# Patient Record
Sex: Male | Born: 1980 | Race: White | Hispanic: No | Marital: Married | Smoking: Never smoker
Health system: Southern US, Community
[De-identification: ages and names within clinical notes are randomized; demographics above are authoritative.]

## PROBLEM LIST (undated history)

## (undated) ENCOUNTER — Ambulatory Visit
Admission: EM | Disposition: A | Discharge status: Other Acute Inpatient Hospital | Payer: No Typology Code available for payment source

---
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
## 2020-11-29 ENCOUNTER — Other Ambulatory Visit: Payer: Self-pay

## 2020-11-29 ENCOUNTER — Ambulatory Visit: Payer: No Typology Code available for payment source | Admitting: Family Medicine

## 2020-11-29 ENCOUNTER — Encounter: Payer: Self-pay | Admitting: Family Medicine

## 2020-11-29 VITALS — BP 110/78 | HR 60 | Temp 97.5°F | Ht 72.0 in | Wt 202.1 lb

## 2020-11-29 DIAGNOSIS — G44209 Tension-type headache, unspecified, not intractable: Secondary | ICD-10-CM

## 2020-11-29 DIAGNOSIS — G4484 Primary exertional headache: Secondary | ICD-10-CM | POA: Diagnosis not present

## 2020-11-29 LAB — CBC WITH DIFFERENTIAL/PLATELET
Basophils Absolute: 0.1 10*3/uL (ref 0.0–0.1)
Basophils Relative: 1.3 % (ref 0.0–3.0)
Eosinophils Absolute: 0.2 10*3/uL (ref 0.0–0.7)
Eosinophils Relative: 3.3 % (ref 0.0–5.0)
HCT: 38.7 % — ABNORMAL LOW (ref 39.0–52.0)
Hemoglobin: 13.2 g/dL (ref 13.0–17.0)
Lymphocytes Relative: 31.5 % (ref 12.0–46.0)
Lymphs Abs: 1.9 10*3/uL (ref 0.7–4.0)
MCHC: 34.2 g/dL (ref 30.0–36.0)
MCV: 90.8 fl (ref 78.0–100.0)
Monocytes Absolute: 0.4 10*3/uL (ref 0.1–1.0)
Monocytes Relative: 6.6 % (ref 3.0–12.0)
Neutro Abs: 3.4 10*3/uL (ref 1.4–7.7)
Neutrophils Relative %: 57.3 % (ref 43.0–77.0)
Platelets: 262 10*3/uL (ref 150.0–400.0)
RBC: 4.26 Mil/uL (ref 4.22–5.81)
RDW: 13.6 % (ref 11.5–15.5)
WBC: 6 10*3/uL (ref 4.0–10.5)

## 2020-11-29 LAB — BASIC METABOLIC PANEL
BUN: 14 mg/dL (ref 6–23)
CO2: 32 mEq/L (ref 19–32)
Calcium: 9.6 mg/dL (ref 8.4–10.5)
Chloride: 103 mEq/L (ref 96–112)
Creatinine, Ser: 1.01 mg/dL (ref 0.40–1.50)
GFR: 94.29 mL/min (ref >60.00)
Glucose, Bld: 98 mg/dL (ref 70–99)
Potassium: 4.1 mEq/L (ref 3.5–5.1)
Sodium: 141 mEq/L (ref 135–145)

## 2020-11-29 LAB — TSH: TSH: 1.41 u[IU]/mL (ref 0.35–4.50)

## 2020-11-29 NOTE — Progress Notes (Signed)
Patient ID: Joe Gibson, male    DOB: Dec 31, 1980, 40 y.o.   MRN: [id number]  This visit was conducted in person.  BP 110/78 (BP Location: Left Arm, Patient Position: Sitting, Cuff Size: Large)   Pulse 60   Temp (!) 97.5 F (36.4 C) (Temporal)   Ht 6' (1.829 m)   Wt 202 lb 1.6 oz (91.7 kg)   SpO2 97%   BMI 27.41 kg/m    CC: new pt, headache Subjective:   HPI: Joe Gibson is a 39 y.o. male presenting on 11/29/2020 for New Patient (Initial Visit)   Started Mg and osteo biflex and vit D and fish oil about 6 months ago for general health.   COVID illness 10/11/2020, took aspirin 81mg  for a month. Symptoms fully resolved.   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 Doesn't think he needed medication for this.   3 wk h/o headaches that started after he started incorporating aerobic exercise (jump rope) into workout routine. No HA with weight lifting. Pulsatile R frontal headache consistently starts 45 seconds after starting to jump rope. Activity limiting, has to stop jumping rope. Headache persists for several hours, improves with excedrin. Recently noted after sex with wife.   No h/o migraines, photo/phonophobia, no nausea/vomiting. Not activity limiting to the point of needing to go to a dark quiet room.  No fevers/chills, neck stiffness, vision changes, unilateral numbness/weakness or paresthesias.   Does routinely get tension headaches but these headaches are different. TTH normally managed with excedrin with benefit.  No pheochromocytoma symptoms of sweating or tachycardia.  No HA with BM or with heavy lifting or straining.  Does routinely see chiropractor once a month.   No prior trauma or head injury.  No concussion history.  No fmhx aneurysms.   Drinking water regularly, limits diet sodas.  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 Interim medical history since our last visit reviewed. Allergies and medications reviewed and updated. Outpatient Medications Prior to Visit  Medication Sig Dispense Refill  . Cholecalciferol (D3) 50 MCG (2000 UT) TABS Take by mouth.    . Magnesium 500 MG CAPS Take by mouth.    . Misc Natural Products (OSTEO BI-FLEX ADV DOUBLE ST PO) Take by mouth.    . Omega-3 Fatty Acids (FISH OIL) 1200 MG CAPS Take by mouth.     No facility-administered medications prior to visit.     Per HPI unless specifically indicated in ROS section below Review of Systems Objective:  BP 110/78 (BP Location: Left Arm, Patient Position: Sitting, Cuff Size: Large)   Pulse 60   Temp (!) 97.5 F (36.4 C) (Temporal)   Ht 6' (1.829 m)   Wt 202 lb 1.6 oz (91.7 kg)   SpO2 97%   BMI 27.41 kg/m   Wt Readings from Last 3 Encounters:  11/29/20 202 lb 1.6 oz (91.7 kg)      Physical Exam Vitals and nursing note reviewed.  Constitutional:      Appearance: Normal appearance. He is not ill-appearing.  HENT:     Head: Normocephalic and atraumatic.     Right Ear: Tympanic membrane,  ear canal and external ear normal. There is no impacted cerumen.     Left Ear: Tympanic membrane, ear canal and external ear normal. There is no impacted cerumen.     Mouth/Throat:     Mouth: Mucous membranes are moist.     Pharynx: Oropharynx is clear. No oropharyngeal exudate or posterior oropharyngeal erythema.  Eyes:     General: Lids are normal.        Right eye: No discharge.        Left eye: No discharge.     Extraocular Movements: Extraocular movements intact.     Conjunctiva/sclera: Conjunctivae normal.     Right eye: Right conjunctiva is not injected.     Left eye: Left conjunctiva is  not injected.     Pupils: Pupils are equal, round, and reactive to light.     Funduscopic exam:    Right eye: No papilledema.        Left eye: No papilledema.     Comments: No papilledema appreciated on limited fundoscopic exam.  Neck:     Thyroid: No thyromegaly or thyroid tenderness.     Vascular: No carotid bruit.  Cardiovascular:     Rate and Rhythm: Normal rate and regular rhythm.     Pulses: Normal pulses.     Heart sounds: Normal heart sounds. No murmur heard.   Pulmonary:     Effort: Pulmonary effort is normal. No respiratory distress.     Breath sounds: Normal breath sounds. No wheezing, rhonchi or rales.  Musculoskeletal:        General: Normal range of motion.     Cervical back: Normal range of motion and neck supple. No rigidity.     Right lower leg: No edema.     Left lower leg: No edema.  Lymphadenopathy:     Cervical: No cervical adenopathy.  Skin:    General: Skin is warm and dry.     Capillary Refill: Capillary refill takes less than 2 seconds.     Findings: No rash.  Neurological:     General: No focal deficit present.     Mental Status: He is alert and oriented to person, place, and time. Mental status is at baseline.     Cranial Nerves: Cranial nerves are intact. No cranial nerve deficit.     Sensory: Sensation is intact.     Motor: Motor function is intact. No weakness or tremor.     Coordination: Coordination is intact. Romberg sign negative. Coordination normal. Finger-Nose-Finger Test normal. Rapid alternating movements normal.     Gait: Gait is intact.     Comments:  CN 2-12 intact FTN intact EOMI No pronator drift  Psychiatric:        Mood and Affect: Mood normal.        Behavior: Behavior normal.       No results found for this or any previous visit. Assessment & Plan:  This visit occurred during the SARS-CoV-2 public health emergency.  Safety protocols were in place, including screening questions prior to the visit, additional usage of  staff PPE, and extensive cleaning of exam room while observing appropriate contact time as indicated for disinfecting solutions.   Over 45 minutes were spent face-to-face with the patient during this encounter or in coordination of care, reviewing past records, time spent documenting, lab results or radiology, or educating the patient or family.  Problem List Items Addressed This Visit    Exertional headache - Primary    New exercise  headaches also present after sex. Will need head imaging to r/o secondary cause (ie vascular abnormality, mass occupying lesion, etc). Discussed possibility of primary exercise headache and if that is the case may try PRN NSAID prior to inciting exercise.  Did suggest stopping supplements until imaging done.  Check labwork r/o organic cause of headache.      Relevant Orders   TSH   CBC with Differential/Platelet   Basic metabolic panel   MR Angiogram Head Wo Contrast   MR Brain Wo Contrast   Tension headache    H/o this, different from new headache pattern, manages effectively with PRN excedrin.           No orders of the defined types were placed in this encounter.  Orders Placed This Encounter  Procedures  . MR Angiogram Head Wo Contrast    Pt ok with [place] or [place]    Standing Status:   Future    Standing Expiration Date:   11/29/2021    Order Specific Question:   What is the patient's sedation requirement?    Answer:   No Sedation    Order Specific Question:   Does the patient have a pacemaker or implanted devices?    Answer:   No    Order Specific Question:   Preferred imaging location?    Answer:   [facility] (table limit-550lbs)  . MR Brain Wo Contrast    Standing Status:   Future    Standing Expiration Date:   11/29/2021    Order Specific Question:   What is the patient's sedation requirement?    Answer:   No Sedation    Order Specific Question:   Does the patient have a pacemaker or implanted devices?    Answer:   No    Order  Specific Question:   Preferred imaging location?    Answer:   [facility] (table limit-550lbs)  . TSH  . CBC with Differential/Platelet  . Basic metabolic panel    Patient Instructions  Avoid inciting activities for now.  Labs today  We will order head imaging and be in touch with results.  Good to meet you today.  Return as needed or in the next year for a physical    Follow up plan: Return if symptoms worsen or fail to improve.  Eustaquio Boyden, MD

## 2020-11-29 NOTE — Assessment & Plan Note (Addendum)
New exercise headaches also present after sex. Will need head imaging to r/o secondary cause (ie vascular abnormality, mass occupying lesion, etc). Discussed possibility of primary exercise headache and if that is the case may try PRN NSAID prior to inciting exercise.  Did suggest stopping supplements until imaging done.  Check labwork r/o organic cause of headache.

## 2020-11-29 NOTE — Assessment & Plan Note (Signed)
H/o this, different from new headache pattern, manages effectively with PRN excedrin.

## 2020-11-29 NOTE — Patient Instructions (Addendum)
Avoid inciting activities for now.  Labs today  We will order head imaging and be in touch with results.  Good to meet you today.  Return as needed or in the next year for a physical

## 2020-12-15 ENCOUNTER — Ambulatory Visit
Admission: RE | Admit: 2020-12-15 | Discharge: 2020-12-15 | Disposition: A | Discharge status: Home / Self Care | Payer: No Typology Code available for payment source | Source: Ambulatory Visit | Attending: Family Medicine | Admitting: Family Medicine

## 2020-12-15 ENCOUNTER — Other Ambulatory Visit: Payer: Self-pay

## 2020-12-15 DIAGNOSIS — G4484 Primary exertional headache: Secondary | ICD-10-CM

## 2020-12-16 ENCOUNTER — Other Ambulatory Visit: Payer: Self-pay | Admitting: Family Medicine

## 2020-12-16 DIAGNOSIS — G4484 Primary exertional headache: Secondary | ICD-10-CM

## 2020-12-16 DIAGNOSIS — G930 Cerebral cysts: Secondary | ICD-10-CM

## 2020-12-19 NOTE — Telephone Encounter (Signed)
error 

## 2021-01-24 ENCOUNTER — Encounter: Payer: Self-pay | Admitting: Family Medicine

## 2021-04-27 ENCOUNTER — Encounter: Payer: Self-pay | Admitting: Family Medicine

## 2021-04-27 DIAGNOSIS — Z3009 Encounter for other general counseling and advice on contraception: Secondary | ICD-10-CM

## 2021-05-05 NOTE — Addendum Note (Signed)
Addended by: Eustaquio Boyden on: 05/05/2021 07:31 AM   Modules accepted: Orders

## 2021-07-07 ENCOUNTER — Other Ambulatory Visit: Payer: Self-pay

## 2021-08-07 ENCOUNTER — Encounter: Payer: Self-pay | Admitting: Family Medicine

## 2021-09-24 ENCOUNTER — Other Ambulatory Visit: Payer: Self-pay | Admitting: Sports Medicine

## 2021-09-24 DIAGNOSIS — S63502A Unspecified sprain of left wrist, initial encounter: Secondary | ICD-10-CM

## 2021-10-09 ENCOUNTER — Encounter: Payer: Self-pay | Admitting: Family Medicine

## 2021-10-09 NOTE — Telephone Encounter (Signed)
C/x OV for abd pain and poss kidney stone.  Fyi to Dr. Reece Agar.

## 2021-10-10 ENCOUNTER — Ambulatory Visit: Payer: No Typology Code available for payment source | Admitting: Family Medicine

## 2021-10-16 ENCOUNTER — Ambulatory Visit
Admission: RE | Admit: 2021-10-16 | Discharge: 2021-10-16 | Disposition: A | Discharge status: Home / Self Care | Payer: No Typology Code available for payment source | Source: Ambulatory Visit | Attending: Sports Medicine | Admitting: Sports Medicine

## 2021-10-16 DIAGNOSIS — S63502A Unspecified sprain of left wrist, initial encounter: Secondary | ICD-10-CM

## 2021-11-04 IMAGING — MR MR HEAD W/O CM
11 series · 48 of 48 positions shown · non-contrast
Comparison: None.

CLINICAL DATA: Headache

EXAM:
MRI HEAD WITHOUT CONTRAST
MRA HEAD WITHOUT CONTRAST
TECHNIQUE: Multiplanar, multiecho pulse sequences of the brain and surrounding
structures were obtained without intravenous contrast. Angiographic
images of the head were obtained using MRA technique without
contrast.

[Series 2: T1 · sagittal · 5.0mm · 0.45mm/px · 3 of 21 slices shown]
[im 1/21]
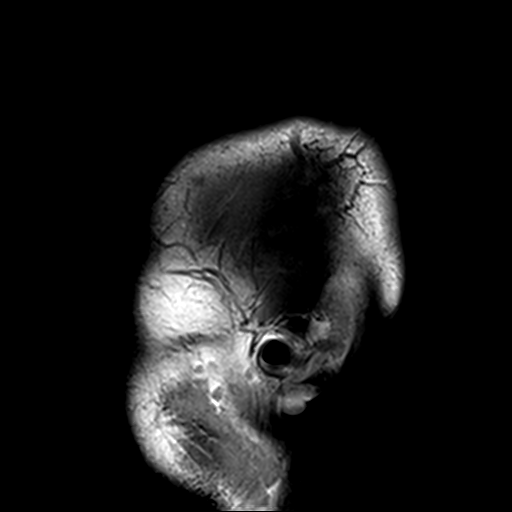
[im 11/21]
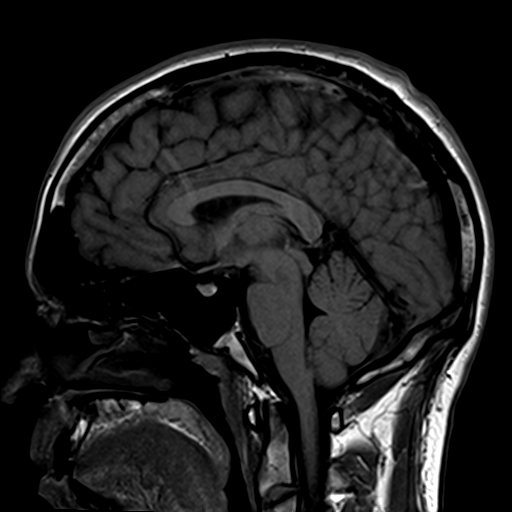
[im 21/21]
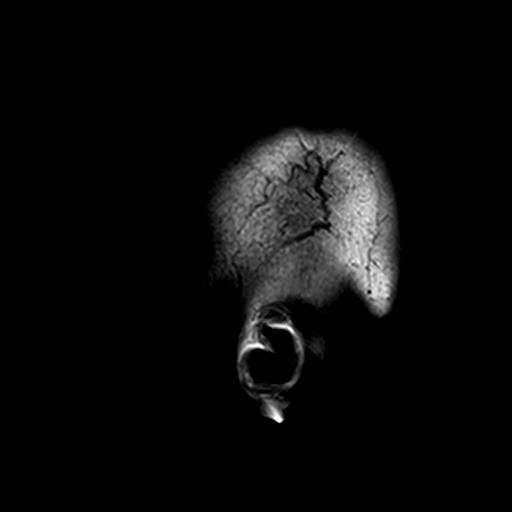

[Series 3: DWI · axial · 3.0mm · 1.80mm/px · z∈[-43,+103]mm · 8 of 99 slices shown (1 of 4)]
[im 1/99]
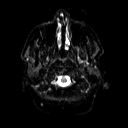
[im 15/99]
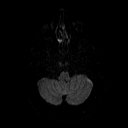
[im 29/99]
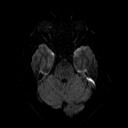
[im 43/99]
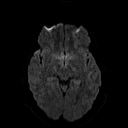
[im 57/99]
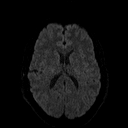
[im 71/99]
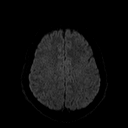
[im 85/99]
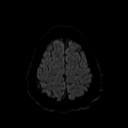
[im 99/99]
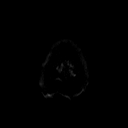

[Series 4: DWI · axial · 3.0mm · 1.80mm/px · z∈[-43,+103]mm · 4 of 50 slices shown (2 of 4)]
[im 1/50]
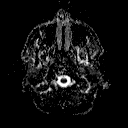
[im 17/50]
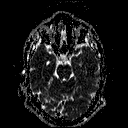
[im 33/50]
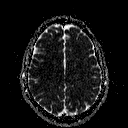
[im 50/50]
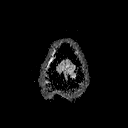

[Series 5: DWI · coronal · 5.0mm · 1.80mm/px · 6 of 74 slices shown (3 of 4)]
[im 1/74]
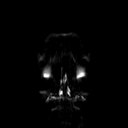
[im 15/74]
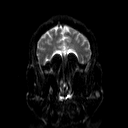
[im 30/74]
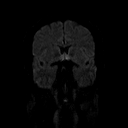
[im 44/74]
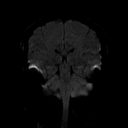
[im 59/74]
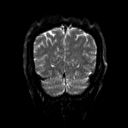
[im 74/74]
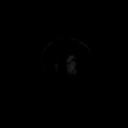

[Series 6: DWI · coronal · 5.0mm · 1.80mm/px · 3 of 37 slices shown (4 of 4)]
[im 1/37]
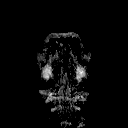
[im 19/37]
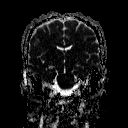
[im 37/37]
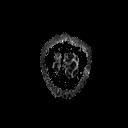

[Series 7: T2 · axial · 5.0mm · 0.51mm/px · z∈[-43,+104]mm · 2 of 22 slices shown (1 of 2)]
[im 1/22]
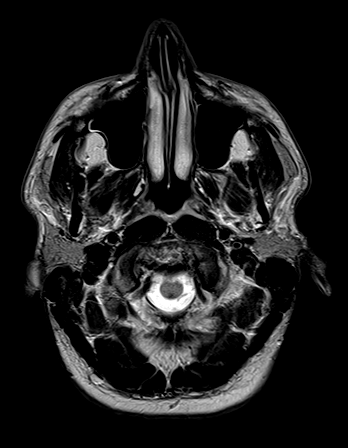
[im 22/22]
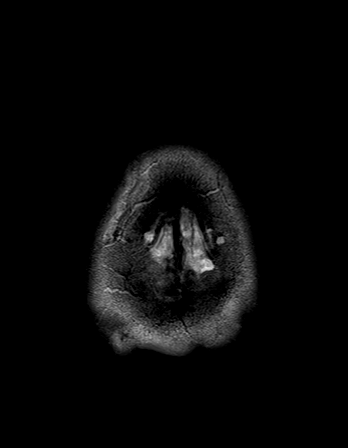

[Series 8: FLAIR · axial · 3.0mm · 0.45mm/px · z∈[-37,+97]mm · 3 of 30 slices shown]
[im 1/30]
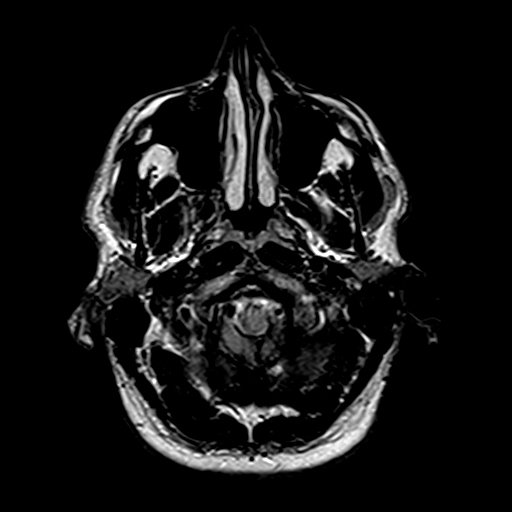
[im 15/30]
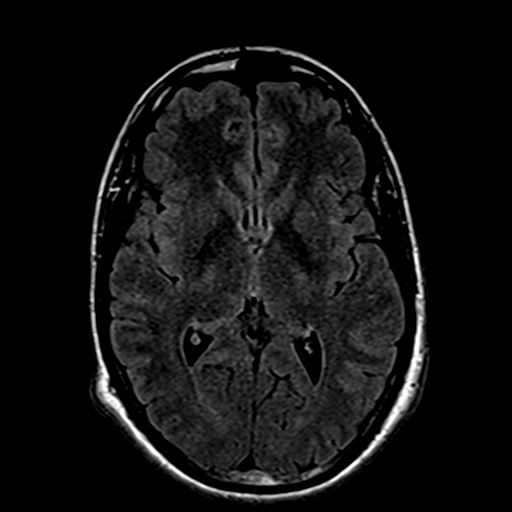
[im 30/30]
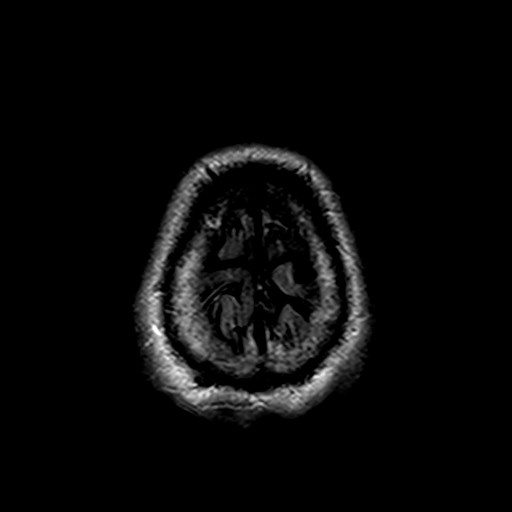

[Series 9: mip_images(sw) · axial · 32.0mm · 0.90mm/px · z∈[-26,+86]mm · 2 of 29 slices shown]
[im 1/29]
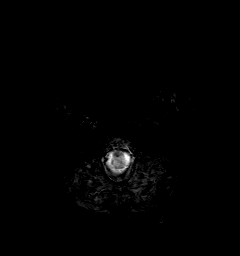
[im 29/29]
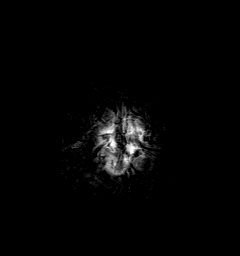

[Series 10: swi_images · axial · 4.0mm · 0.90mm/px · z∈[-40,+100]mm · 3 of 36 slices shown]
[im 1/36]
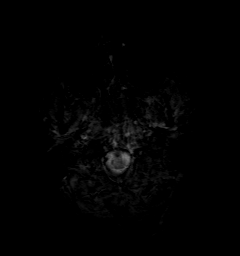
[im 18/36]
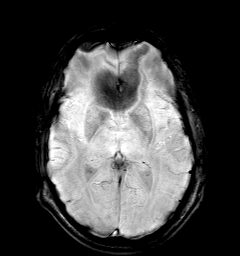
[im 36/36]
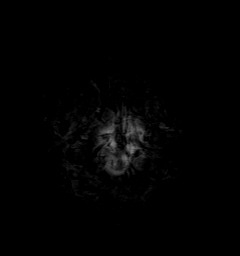

[Series 11: t1_mpr_tra · axial · 1.0mm · 0.71mm/px · z∈[-41,+102]mm · 12 of 144 slices shown]
[im 1/144]
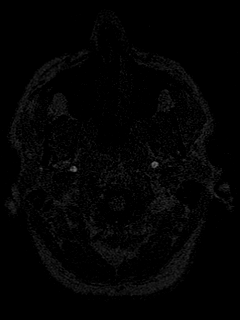
[im 14/144]
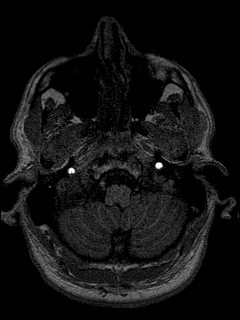
[im 27/144]
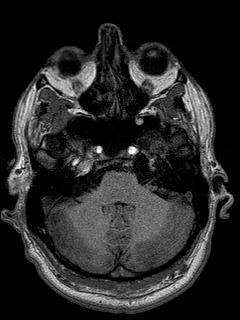
[im 40/144]
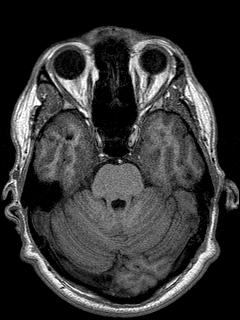
[im 53/144]
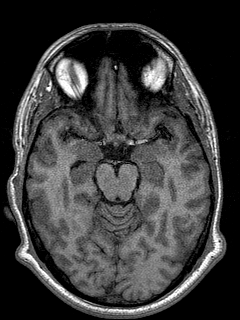
[im 66/144]
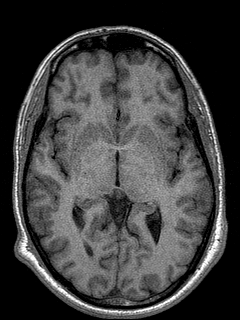
[im 79/144]
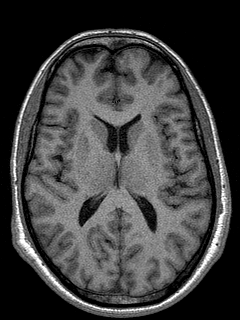
[im 92/144]
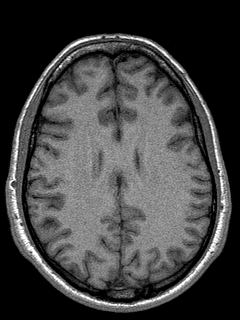
[im 105/144]
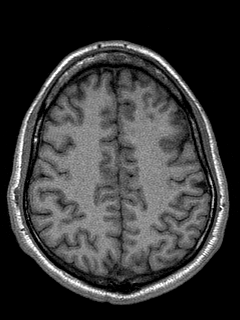
[im 118/144]
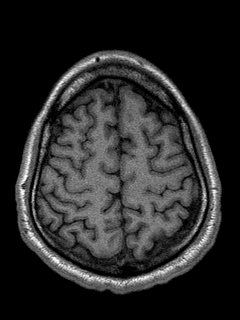
[im 131/144]
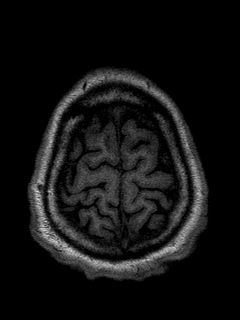
[im 144/144]
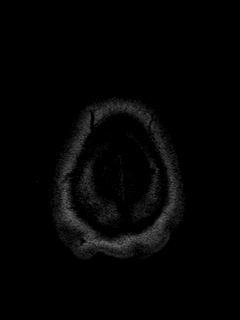

[Series 12: T2 · coronal · 5.0mm · 0.45mm/px · 2 of 28 slices shown (2 of 2)]
[im 1/28]
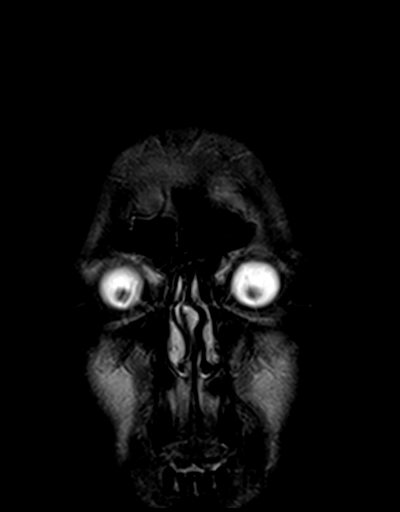
[im 28/28]
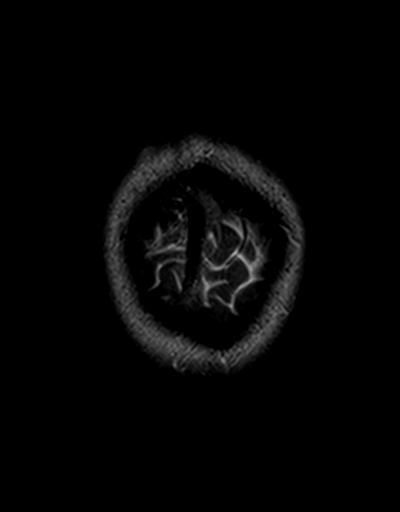

[48 of 48 positions shown; findings below may reference images not displayed]

FINDINGS: MRI HEAD FINDINGS

Brain: No acute infarction, hemorrhage, or hydrocephalus. No midline
shift. There is a CSF intensity cyst at the right cerebellopontine
angle measuring up to approximately 1.8 x 1.5 cm which suppresses on
FLAIR imaging and does not demonstrate restricted diffusion (series
7 and 8, image 4). This lesion appears separate from the seventh and
eighth cranial nerves. Mild mass effect on the adjacent cerebellum.

Skull and upper cervical spine: Normal marrow signal.

Sinuses/Orbits: Minimal mucosal thickening of the frontal, ethmoid
and maxillary sinuses without air-fluid levels. Unremarkable orbits.

Other: Trace mastoid fluid bilaterally.

MRA HEAD FINDINGS

Anterior circulation: No large vessel occlusion, proximal
hemodynamically significant stenosis, or aneurysm. Small conical
outpouching arising from the left carotid terminus inferiorly with
vessel arising from the tip, compatible with infundibulum.

Posterior circulation: The visualized left intradural vertebral
artery is smaller than right, likely non dominant. No large vessel
occlusion, proximal hemodynamically significant stenosis, or
aneurysm.
IMPRESSION: MRI:

1. No evidence of acute abnormality.
2. Approximately 1.8 cm right cerebellopontine angle cystic lesion,
described above and most likely an arachnoid cyst. Mild mass effect
on the adjacent cerebellum.

MRA:

No large vessel occlusion, proximal hemodynamically significant
stenosis, or aneurysm.

## 2021-11-04 IMAGING — MR MR MRA HEAD W/O CM
1 series · 22 of 48 positions shown · non-contrast
Comparison: None.

CLINICAL DATA: Headache

EXAM:
MRI HEAD WITHOUT CONTRAST
MRA HEAD WITHOUT CONTRAST
TECHNIQUE: Multiplanar, multiecho pulse sequences of the brain and surrounding
structures were obtained without intravenous contrast. Angiographic
images of the head were obtained using MRA technique without
contrast.

[Series 3: tof_3d_multi-slab · axial · 0.7mm · 0.35mm/px · z∈[-49,+37]mm · 22 of 130 slices shown]
[im 1/130]
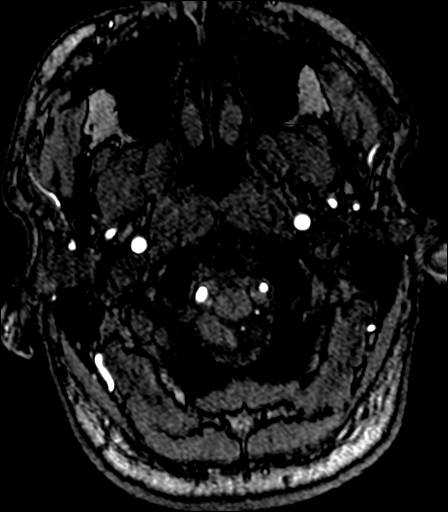
[im 3/130]
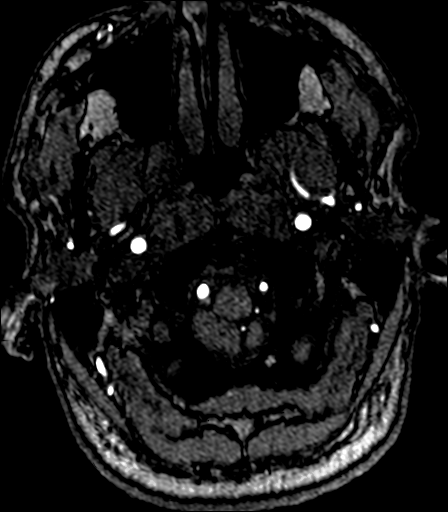
[im 6/130]
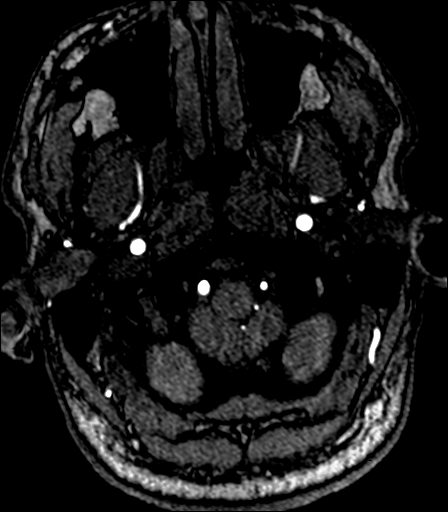
[im 9/130]
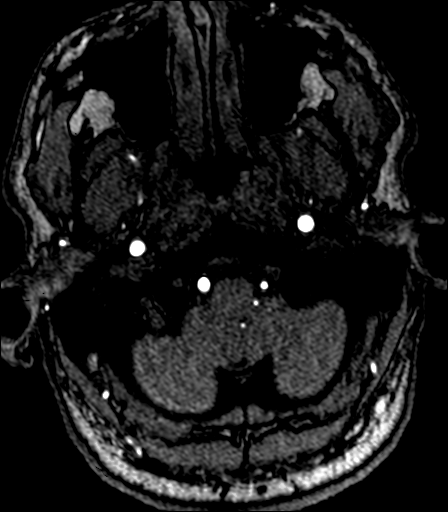
[im 11/130]
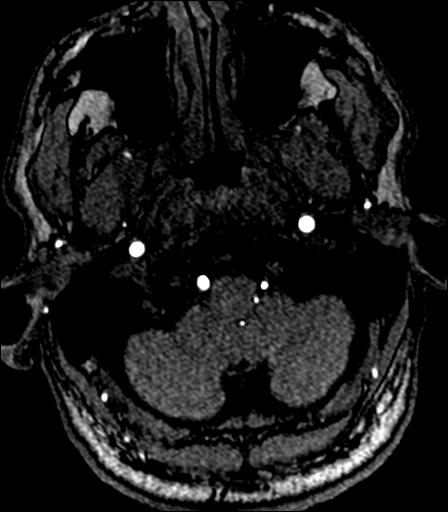
[im 14/130]
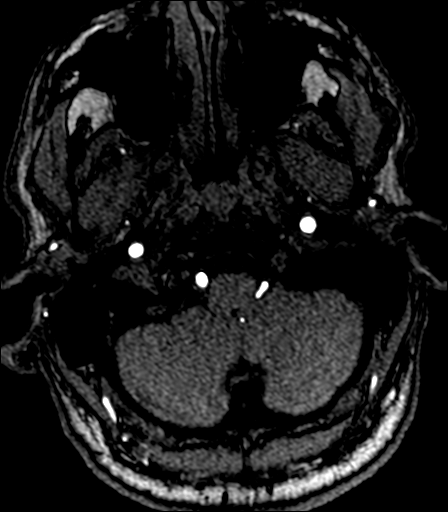
[im 17/130]
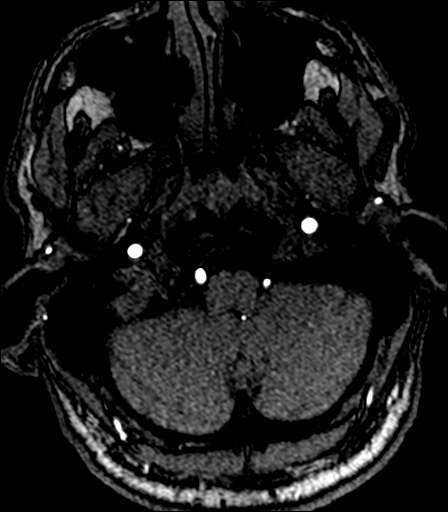
[im 20/130]
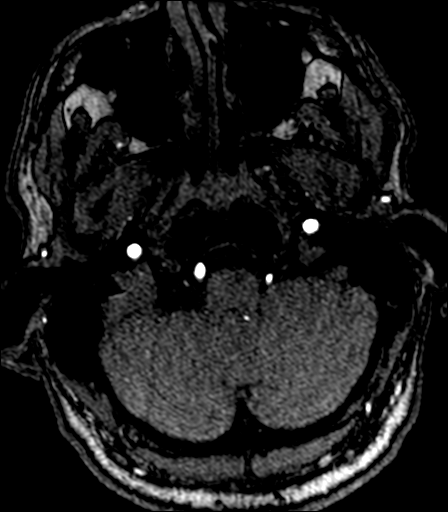
[im 22/130]
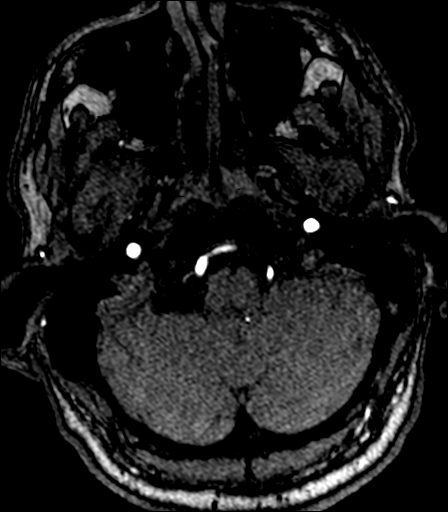
[im 25/130]
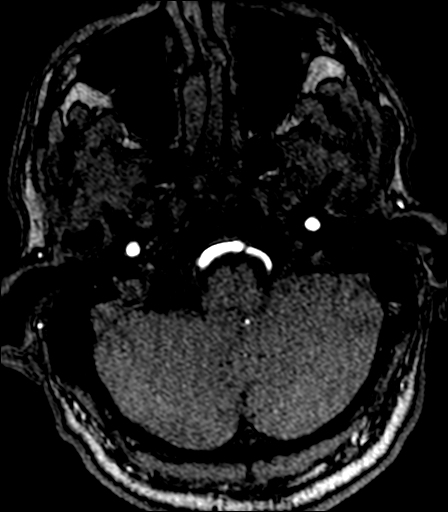
[im 28/130]
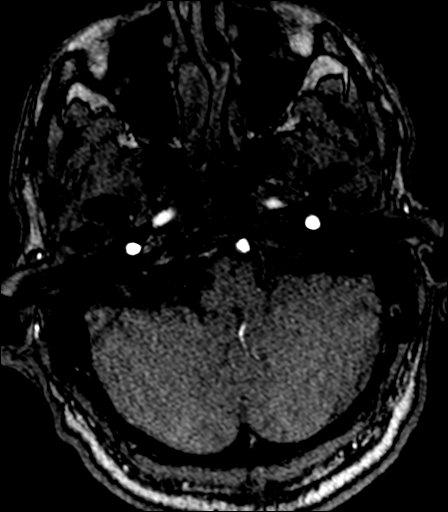
[im 31/130]
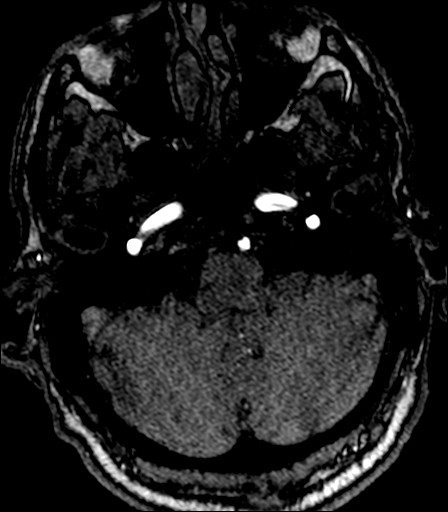
[im 33/130]
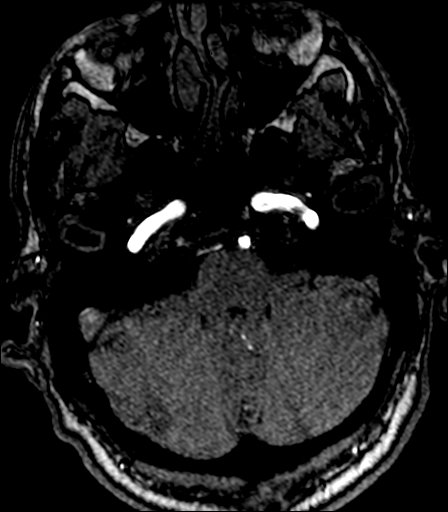
[im 36/130]
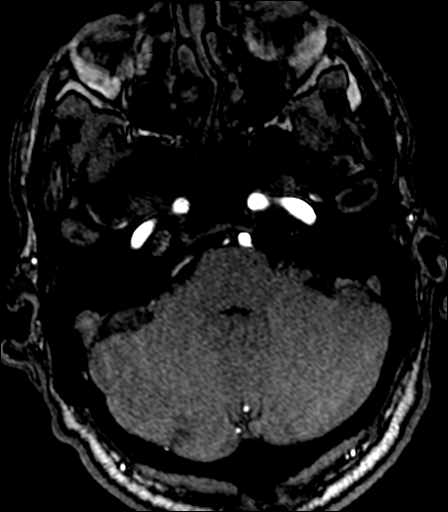
[im 42/130]
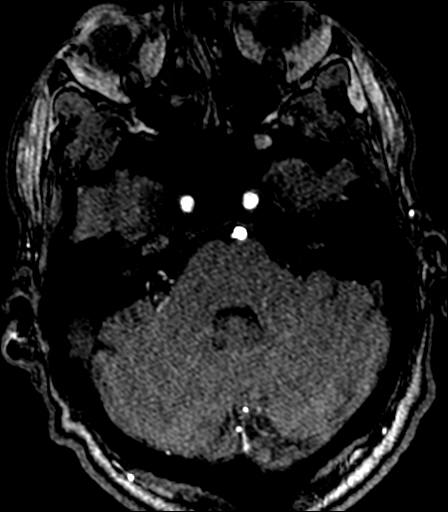
[im 58/130]
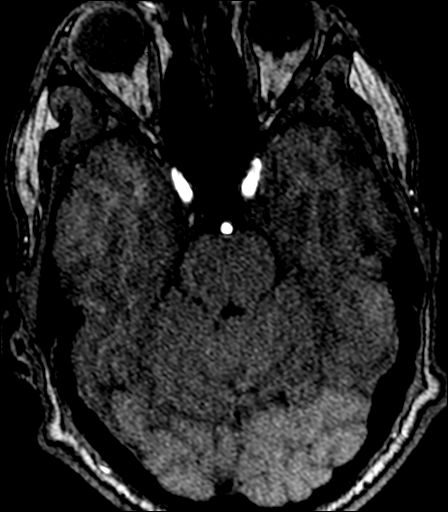
[im 66/130]
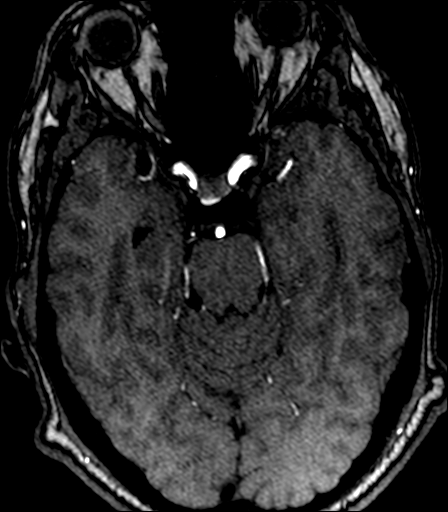
[im 75/130]
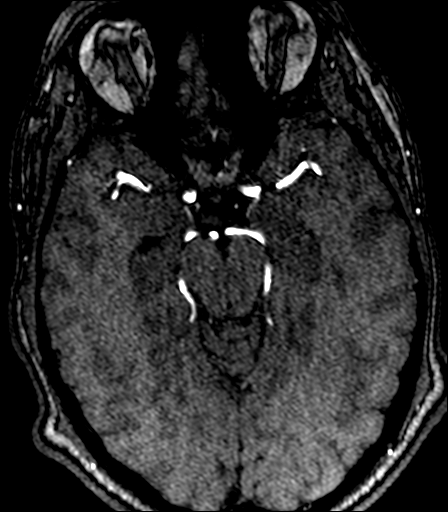
[im 91/130]
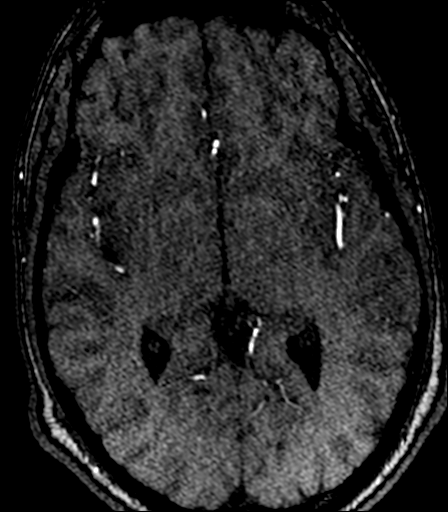
[im 108/130]
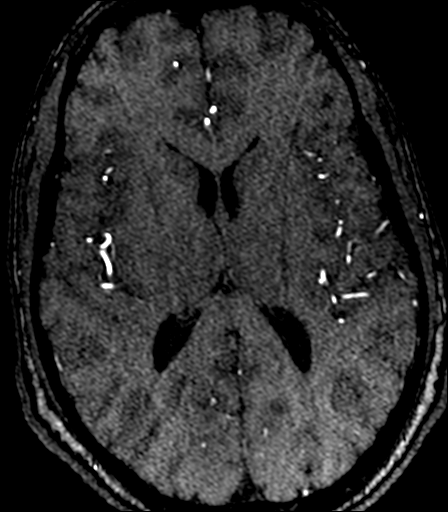
[im 110/130]
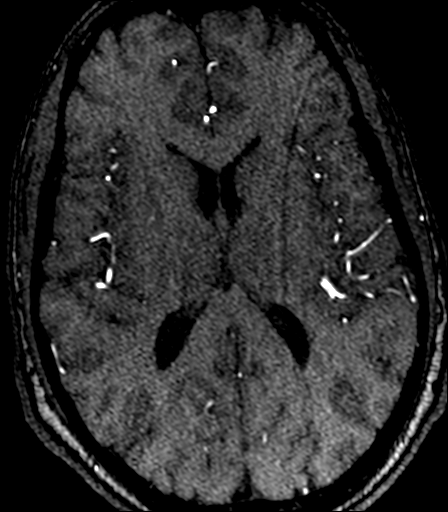
[im 124/130]
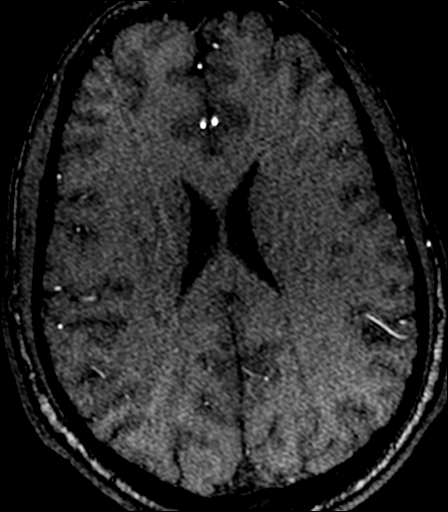

[22 of 48 positions shown; findings below may reference images not displayed]

FINDINGS: MRI HEAD FINDINGS

Brain: No acute infarction, hemorrhage, or hydrocephalus. No midline
shift. There is a CSF intensity cyst at the right cerebellopontine
angle measuring up to approximately 1.8 x 1.5 cm which suppresses on
FLAIR imaging and does not demonstrate restricted diffusion (series
7 and 8, image 4). This lesion appears separate from the seventh and
eighth cranial nerves. Mild mass effect on the adjacent cerebellum.

Skull and upper cervical spine: Normal marrow signal.

Sinuses/Orbits: Minimal mucosal thickening of the frontal, ethmoid
and maxillary sinuses without air-fluid levels. Unremarkable orbits.

Other: Trace mastoid fluid bilaterally.

MRA HEAD FINDINGS

Anterior circulation: No large vessel occlusion, proximal
hemodynamically significant stenosis, or aneurysm. Small conical
outpouching arising from the left carotid terminus inferiorly with
vessel arising from the tip, compatible with infundibulum.

Posterior circulation: The visualized left intradural vertebral
artery is smaller than right, likely non dominant. No large vessel
occlusion, proximal hemodynamically significant stenosis, or
aneurysm.
IMPRESSION: MRI:

1. No evidence of acute abnormality.
2. Approximately 1.8 cm right cerebellopontine angle cystic lesion,
described above and most likely an arachnoid cyst. Mild mass effect
on the adjacent cerebellum.

MRA:

No large vessel occlusion, proximal hemodynamically significant
stenosis, or aneurysm.

## 2023-06-01 ENCOUNTER — Other Ambulatory Visit: Payer: Self-pay | Admitting: Family Medicine

## 2023-06-01 DIAGNOSIS — Z13220 Encounter for screening for lipoid disorders: Secondary | ICD-10-CM

## 2023-06-01 DIAGNOSIS — Z1159 Encounter for screening for other viral diseases: Secondary | ICD-10-CM

## 2023-06-01 DIAGNOSIS — Z131 Encounter for screening for diabetes mellitus: Secondary | ICD-10-CM

## 2023-06-11 ENCOUNTER — Other Ambulatory Visit (INDEPENDENT_AMBULATORY_CARE_PROVIDER_SITE_OTHER): Payer: 59

## 2023-06-11 DIAGNOSIS — Z136 Encounter for screening for cardiovascular disorders: Secondary | ICD-10-CM

## 2023-06-11 DIAGNOSIS — Z131 Encounter for screening for diabetes mellitus: Secondary | ICD-10-CM | POA: Diagnosis not present

## 2023-06-11 DIAGNOSIS — Z13220 Encounter for screening for lipoid disorders: Secondary | ICD-10-CM

## 2023-06-11 DIAGNOSIS — Z1159 Encounter for screening for other viral diseases: Secondary | ICD-10-CM | POA: Diagnosis not present

## 2023-06-11 LAB — BASIC METABOLIC PANEL
BUN: 13 mg/dL (ref 6–23)
CO2: 29 meq/L (ref 19–32)
Calcium: 9.6 mg/dL (ref 8.4–10.5)
Chloride: 102 meq/L (ref 96–112)
Creatinine, Ser: 1.18 mg/dL (ref 0.40–1.50)
GFR: 76.86 mL/min (ref >60.00)
Glucose, Bld: 94 mg/dL (ref 70–99)
Potassium: 4 meq/L (ref 3.5–5.1)
Sodium: 139 meq/L (ref 135–145)

## 2023-06-11 LAB — LIPID PANEL
Cholesterol: 190 mg/dL (ref 0–200)
HDL: 64.4 mg/dL (ref >39.00)
LDL Cholesterol: 113 mg/dL — ABNORMAL HIGH (ref 0–99)
NonHDL: 125.58
Total CHOL/HDL Ratio: 3
Triglycerides: 61 mg/dL (ref 0.0–149.0)
VLDL: 12.2 mg/dL (ref 0.0–40.0)

## 2023-06-12 LAB — HEPATITIS C ANTIBODY: Hepatitis C Ab: NONREACTIVE

## 2023-06-18 ENCOUNTER — Ambulatory Visit (INDEPENDENT_AMBULATORY_CARE_PROVIDER_SITE_OTHER): Payer: 59 | Admitting: Family Medicine

## 2023-06-18 ENCOUNTER — Encounter: Payer: Self-pay | Admitting: Family Medicine

## 2023-06-18 VITALS — BP 124/78 | HR 58 | Temp 98.0°F | Ht 71.5 in | Wt 192.1 lb

## 2023-06-18 DIAGNOSIS — J339 Nasal polyp, unspecified: Secondary | ICD-10-CM | POA: Insufficient documentation

## 2023-06-18 DIAGNOSIS — Z0000 Encounter for general adult medical examination without abnormal findings: Secondary | ICD-10-CM | POA: Insufficient documentation

## 2023-06-18 DIAGNOSIS — J309 Allergic rhinitis, unspecified: Secondary | ICD-10-CM | POA: Insufficient documentation

## 2023-06-18 DIAGNOSIS — R0683 Snoring: Secondary | ICD-10-CM | POA: Insufficient documentation

## 2023-06-18 DIAGNOSIS — Z23 Encounter for immunization: Secondary | ICD-10-CM | POA: Diagnosis not present

## 2023-06-18 DIAGNOSIS — J302 Other seasonal allergic rhinitis: Secondary | ICD-10-CM

## 2023-06-18 DIAGNOSIS — Z0001 Encounter for general adult medical examination with abnormal findings: Secondary | ICD-10-CM | POA: Insufficient documentation

## 2023-06-18 DIAGNOSIS — G4733 Obstructive sleep apnea (adult) (pediatric): Secondary | ICD-10-CM | POA: Insufficient documentation

## 2023-06-18 LAB — POC URINALSYSI DIPSTICK (AUTOMATED)
Bilirubin, UA: NEGATIVE
Blood, UA: NEGATIVE
Glucose, UA: NEGATIVE
Ketones, UA: NEGATIVE
Leukocytes, UA: NEGATIVE
Nitrite, UA: NEGATIVE
Protein, UA: NEGATIVE
Spec Grav, UA: 1.02 (ref 1.010–1.025)
Urobilinogen, UA: 0.2 U/dL
pH, UA: 7 (ref 5.0–8.0)

## 2023-06-18 NOTE — Progress Notes (Signed)
Ph: [phone] Fax: [phone]   Patient ID: Joe Gibson, male    DOB: 1981/03/10, 42 y.o.   MRN: [id number]  This visit was conducted in person.  BP 124/78   Pulse (!) 58   Temp 98 F (36.7 C) (Oral)   Ht 5' 11.5" (1.816 m)   Wt 192 lb 2 oz (87.1 kg)   SpO2 99%   BMI 26.42 kg/m    CC: CPE Subjective:   HPI: Joe Gibson is a 42 y.o. 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 Symptoms fully resolved.    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 Saw neurosurgery Dr Conchita Paris - didn't think this cyst should cause headaches.   GFR dropped from 90s to 70s this year. Fmhx kidney stones. He has never had a kidney stone. No blood in urine.   Wife notes ongoing loud snoring. This is more bothersome to wife than to him. No morning headaches, no PNdyspnea or witnessed apnea.  Notes can easily fall asleep.  Has tried strips, sprays, positions, pillows.   New onset allergies over the past 12 months. Has tried flonase and astelin nasal sprays.   Preventative: Colon cancer screening - not due Prostate cancer screening - not due Lung cancer screening - not due Flu shot - yearly COVID - 09/2019, 2/201, booster 07/2020 Tetanus shot - unsure but <10 yrs  Pneumonia shot - not due  Shingrix - not due Advanced directive discussion -  Seat belt use discussed Sunscreen use discussed. No changing moles on skin. Sees derm yearly for skin cancer screen.  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 Interim medical history since our last visit reviewed. Allergies and medications reviewed and updated. Outpatient Medications Prior to Visit  Medication Sig Dispense Refill   Cholecalciferol (D3) 50 MCG (2000 UT) TABS Take by mouth.     Misc Natural Products (OSTEO BI-FLEX ADV DOUBLE ST PO) Take by mouth.     Magnesium 500 MG CAPS Take by mouth.     Omega-3 Fatty Acids (FISH OIL) 1200 MG CAPS Take by mouth.     No facility-administered medications prior to visit.     Per HPI unless specifically indicated in ROS section below Review of Systems  Constitutional:  Negative for activity change, appetite change, chills, fatigue, fever and unexpected weight change.  HENT:  Negative for hearing loss.   Eyes:  Negative for visual disturbance.  Respiratory:  Positive for cough (recent URI (dry cough)). Negative for chest tightness, shortness of breath and wheezing.   Cardiovascular:  Negative for chest pain, palpitations and leg swelling.  Gastrointestinal:  Negative for abdominal distention, abdominal pain, blood in stool, constipation, diarrhea, nausea and vomiting.  Genitourinary:  Negative for difficulty urinating and hematuria.  Musculoskeletal:  Negative for arthralgias, myalgias and neck pain.  Skin:  Negative for rash.  Neurological:  Negative for dizziness, seizures, syncope and headaches.  Hematological:  Negative for adenopathy. Does not bruise/bleed easily.  Psychiatric/Behavioral:  Negative for dysphoric mood. The patient is not nervous/anxious.     Objective:  BP 124/78   Pulse (!) 58   Temp 98 F (36.7 C) (Oral)   Ht 5' 11.5" (1.816 m)   Wt 192 lb 2 oz (87.1 kg)   SpO2 99%   BMI 26.42 kg/m   Wt Readings from Last 3 Encounters:  06/18/23 192 lb 2  oz (87.1 kg)  11/29/20 202 lb 1.6 oz (91.7 kg)      Physical Exam Vitals and nursing note reviewed.  Constitutional:      General: He is not in acute distress.    Appearance: Normal appearance. He is well-developed. He is not ill-appearing.  HENT:     Head: Normocephalic and atraumatic.     Right Ear: Hearing, tympanic membrane, ear canal and external ear normal.     Left Ear: Hearing, tympanic membrane, ear canal and external ear normal.     Nose: Mucosal edema present. No congestion.     Right Turbinates: Not enlarged, swollen or pale.     Left Turbinates: Not enlarged, swollen or pale.     Comments: L nasal polyp present     Mouth/Throat:     Mouth: Mucous membranes are moist.     Pharynx: Oropharynx is clear. No oropharyngeal exudate or posterior oropharyngeal erythema.  Eyes:     General: No scleral icterus.    Extraocular Movements: Extraocular movements intact.     Conjunctiva/sclera: Conjunctivae normal.     Pupils: Pupils are equal, round, and reactive to light.  Neck:     Thyroid: No thyroid mass or thyromegaly.  Cardiovascular:     Rate and Rhythm: Normal rate and regular rhythm.     Pulses: Normal pulses.          Radial pulses are 2+ on the right side and 2+ on the left side.     Heart sounds: Normal heart sounds. No murmur heard. Pulmonary:     Effort: Pulmonary effort is normal. No respiratory distress.     Breath sounds: Normal breath sounds. No wheezing, rhonchi or rales.  Abdominal:     General: Bowel sounds are normal. There is no distension.     Palpations: Abdomen is soft. There is no mass.     Tenderness: There is no abdominal tenderness. There is no guarding or rebound.     Hernia: No hernia is present.  Musculoskeletal:        General: Normal range of motion.     Cervical back: Normal range of motion and neck supple.     Right lower leg: No edema.     Left lower leg: No edema.  Lymphadenopathy:     Cervical: No cervical adenopathy.  Skin:     General: Skin is warm and dry.     Findings: No rash.  Neurological:     General: No focal deficit present.     Mental Status: He is alert and oriented to person, place, and time.  Psychiatric:        Mood and Affect: Mood normal.        Behavior: Behavior normal.        Thought Content: Thought content normal.        Judgment: Judgment normal.       Results for orders placed or  performed in visit on 06/11/23  Hepatitis C antibody  Result Value Ref Range   Hepatitis C Ab NON-REACTIVE NON-REACTIVE  Basic metabolic panel  Result Value Ref Range   Sodium 139 135 - 145 mEq/L   Potassium 4.0 3.5 - 5.1 mEq/L   Chloride 102 96 - 112 mEq/L   CO2 29 19 - 32 mEq/L   Glucose, Bld 94 70 - 99 mg/dL   BUN 13 6 - 23 mg/dL   Creatinine, Ser 1.61 0.40 - 1.50 mg/dL   GFR 09.60 >45.40 mL/min   Calcium 9.6 8.4 - 10.5 mg/dL  Lipid panel  Result Value Ref Range   Cholesterol 190 0 - 200 mg/dL   Triglycerides 98.1 0.0 - 149.0 mg/dL   HDL 19.14 >78.29 mg/dL   VLDL 56.2 0.0 - 13.0 mg/dL   LDL Cholesterol 865 (H) 0 - 99 mg/dL   Total CHOL/HDL Ratio 3    NonHDL 125.58     Assessment & Plan:   Problem List Items Addressed This Visit     Health maintenance examination - Primary (Chronic)    Preventative protocols reviewed and updated unless pt declined. Discussed healthy diet and lifestyle.  Update UA with GFR trend and in fmhx kidney stones.  The 10-year ASCVD risk score (Arnett DK, et al., 2019) is: 0.8%   Values used to calculate the score:     Age: 73 years     Sex: Male     Is Non-Hispanic African American: No     Diabetic: No     Tobacco smoker: No     Systolic Blood Pressure: 124 mmHg     Is BP treated: No     HDL Cholesterol: 64.4 mg/dL     Total Cholesterol: 190 mg/dL       Relevant Orders   POCT Urinalysis Dipstick (Automated)   Allergic rhinitis    Discussed flonase, astelin, nasal saline irrigation.       Nasal polyp    Left sided - restart flonase, update if  worsening nasal obstruction symptoms for ENT eval.       Snoring    Snoring associated with daytime somnolence.  ESS = 9 Will order HST for further evaluation.       Other Visit Diagnoses     Encounter for immunization       Relevant Orders   Flu vaccine trivalent PF, 6mos and older(Flulaval,Afluria,Fluarix,Fluzone) (Completed)        No orders of the defined types were placed in this encounter.   Orders Placed This Encounter  Procedures   Flu vaccine trivalent PF, 6mos and older(Flulaval,Afluria,Fluarix,Fluzone)   POCT Urinalysis Dipstick (Automated)    Patient Instructions  Flu shot today  Urinalysis today.  Restart flonase for possible left sided nasal polyp, let me know if worsening symptoms  Sleep questionairre today. We will refer you to sleep doctor.  Good to see you today  Continue regular exercise and diet.   Follow up plan: Return in about 1 year (around 06/17/2024) for annual exam, prior fasting for blood work.  Eustaquio Boyden, MD

## 2023-06-18 NOTE — Assessment & Plan Note (Signed)
Discussed flonase, astelin, nasal saline irrigation.

## 2023-06-18 NOTE — Assessment & Plan Note (Addendum)
Preventative protocols reviewed and updated unless pt declined. Discussed healthy diet and lifestyle.  Update UA with GFR trend and in fmhx kidney stones.  The 10-year ASCVD risk score (Arnett DK, et al., 2019) is: 0.8%   Values used to calculate the score:     Age: 42 years     Sex: Male     Is Non-Hispanic African American: No     Diabetic: No     Tobacco smoker: No     Systolic Blood Pressure: 124 mmHg     Is BP treated: No     HDL Cholesterol: 64.4 mg/dL     Total Cholesterol: 190 mg/dL

## 2023-06-18 NOTE — Assessment & Plan Note (Signed)
Left sided - restart flonase, update if worsening nasal obstruction symptoms for ENT eval.

## 2023-06-18 NOTE — Assessment & Plan Note (Signed)
Snoring associated with daytime somnolence.  ESS = 9 Will order HST for further evaluation.

## 2023-06-18 NOTE — Patient Instructions (Addendum)
Flu shot today  Urinalysis today.  Restart flonase for possible left sided nasal polyp, let me know if worsening symptoms  Sleep questionairre today. We will refer you to sleep doctor.  Good to see you today  Continue regular exercise and diet.

## 2023-07-15 ENCOUNTER — Encounter: Payer: Self-pay | Admitting: Family Medicine

## 2023-07-15 DIAGNOSIS — G4733 Obstructive sleep apnea (adult) (pediatric): Secondary | ICD-10-CM

## 2023-07-16 NOTE — Telephone Encounter (Signed)
Have you seen report for this if not I can print.

## 2023-09-06 ENCOUNTER — Encounter: Payer: Self-pay | Admitting: Family Medicine

## 2023-10-04 DIAGNOSIS — M5386 Other specified dorsopathies, lumbar region: Secondary | ICD-10-CM | POA: Diagnosis not present

## 2023-10-04 DIAGNOSIS — M9903 Segmental and somatic dysfunction of lumbar region: Secondary | ICD-10-CM | POA: Diagnosis not present

## 2023-10-04 DIAGNOSIS — M9902 Segmental and somatic dysfunction of thoracic region: Secondary | ICD-10-CM | POA: Diagnosis not present

## 2023-10-04 DIAGNOSIS — M9904 Segmental and somatic dysfunction of sacral region: Secondary | ICD-10-CM | POA: Diagnosis not present

## 2023-10-15 DIAGNOSIS — D2271 Melanocytic nevi of right lower limb, including hip: Secondary | ICD-10-CM | POA: Diagnosis not present

## 2023-10-15 DIAGNOSIS — L219 Seborrheic dermatitis, unspecified: Secondary | ICD-10-CM | POA: Diagnosis not present

## 2023-10-15 DIAGNOSIS — L821 Other seborrheic keratosis: Secondary | ICD-10-CM | POA: Diagnosis not present

## 2023-10-15 DIAGNOSIS — Z411 Encounter for cosmetic surgery: Secondary | ICD-10-CM | POA: Diagnosis not present

## 2023-11-01 DIAGNOSIS — M25562 Pain in left knee: Secondary | ICD-10-CM | POA: Diagnosis not present

## 2023-11-15 DIAGNOSIS — M9902 Segmental and somatic dysfunction of thoracic region: Secondary | ICD-10-CM | POA: Diagnosis not present

## 2023-11-15 DIAGNOSIS — M9903 Segmental and somatic dysfunction of lumbar region: Secondary | ICD-10-CM | POA: Diagnosis not present

## 2023-11-15 DIAGNOSIS — M5386 Other specified dorsopathies, lumbar region: Secondary | ICD-10-CM | POA: Diagnosis not present

## 2023-11-15 DIAGNOSIS — M9904 Segmental and somatic dysfunction of sacral region: Secondary | ICD-10-CM | POA: Diagnosis not present

## 2023-11-20 DIAGNOSIS — G4733 Obstructive sleep apnea (adult) (pediatric): Secondary | ICD-10-CM | POA: Diagnosis not present

## 2023-12-21 DIAGNOSIS — G4733 Obstructive sleep apnea (adult) (pediatric): Secondary | ICD-10-CM | POA: Diagnosis not present

## 2024-01-03 DIAGNOSIS — M9902 Segmental and somatic dysfunction of thoracic region: Secondary | ICD-10-CM | POA: Diagnosis not present

## 2024-01-03 DIAGNOSIS — M9903 Segmental and somatic dysfunction of lumbar region: Secondary | ICD-10-CM | POA: Diagnosis not present

## 2024-01-03 DIAGNOSIS — M9904 Segmental and somatic dysfunction of sacral region: Secondary | ICD-10-CM | POA: Diagnosis not present

## 2024-01-03 DIAGNOSIS — M5386 Other specified dorsopathies, lumbar region: Secondary | ICD-10-CM | POA: Diagnosis not present

## 2024-01-20 DIAGNOSIS — G4733 Obstructive sleep apnea (adult) (pediatric): Secondary | ICD-10-CM | POA: Diagnosis not present

## 2024-02-14 DIAGNOSIS — M9902 Segmental and somatic dysfunction of thoracic region: Secondary | ICD-10-CM | POA: Diagnosis not present

## 2024-02-14 DIAGNOSIS — M9904 Segmental and somatic dysfunction of sacral region: Secondary | ICD-10-CM | POA: Diagnosis not present

## 2024-02-14 DIAGNOSIS — M9903 Segmental and somatic dysfunction of lumbar region: Secondary | ICD-10-CM | POA: Diagnosis not present

## 2024-02-14 DIAGNOSIS — M5386 Other specified dorsopathies, lumbar region: Secondary | ICD-10-CM | POA: Diagnosis not present

## 2024-02-20 DIAGNOSIS — G4733 Obstructive sleep apnea (adult) (pediatric): Secondary | ICD-10-CM | POA: Diagnosis not present

## 2024-03-21 DIAGNOSIS — G4733 Obstructive sleep apnea (adult) (pediatric): Secondary | ICD-10-CM | POA: Diagnosis not present

## 2024-03-26 DIAGNOSIS — M9903 Segmental and somatic dysfunction of lumbar region: Secondary | ICD-10-CM | POA: Diagnosis not present

## 2024-03-26 DIAGNOSIS — M9904 Segmental and somatic dysfunction of sacral region: Secondary | ICD-10-CM | POA: Diagnosis not present

## 2024-03-26 DIAGNOSIS — M5386 Other specified dorsopathies, lumbar region: Secondary | ICD-10-CM | POA: Diagnosis not present

## 2024-03-26 DIAGNOSIS — M9902 Segmental and somatic dysfunction of thoracic region: Secondary | ICD-10-CM | POA: Diagnosis not present

## 2024-04-21 DIAGNOSIS — G4733 Obstructive sleep apnea (adult) (pediatric): Secondary | ICD-10-CM | POA: Diagnosis not present

## 2024-05-07 DIAGNOSIS — M9904 Segmental and somatic dysfunction of sacral region: Secondary | ICD-10-CM | POA: Diagnosis not present

## 2024-05-07 DIAGNOSIS — M9903 Segmental and somatic dysfunction of lumbar region: Secondary | ICD-10-CM | POA: Diagnosis not present

## 2024-05-07 DIAGNOSIS — M9902 Segmental and somatic dysfunction of thoracic region: Secondary | ICD-10-CM | POA: Diagnosis not present

## 2024-05-07 DIAGNOSIS — M5386 Other specified dorsopathies, lumbar region: Secondary | ICD-10-CM | POA: Diagnosis not present

## 2024-06-08 ENCOUNTER — Other Ambulatory Visit: Payer: Self-pay | Admitting: Family Medicine

## 2024-06-08 DIAGNOSIS — Z131 Encounter for screening for diabetes mellitus: Secondary | ICD-10-CM

## 2024-06-08 DIAGNOSIS — G4733 Obstructive sleep apnea (adult) (pediatric): Secondary | ICD-10-CM

## 2024-06-08 DIAGNOSIS — Z13220 Encounter for screening for lipoid disorders: Secondary | ICD-10-CM

## 2024-06-12 ENCOUNTER — Other Ambulatory Visit: Payer: 59

## 2024-06-18 ENCOUNTER — Other Ambulatory Visit (INDEPENDENT_AMBULATORY_CARE_PROVIDER_SITE_OTHER)

## 2024-06-18 DIAGNOSIS — Z13220 Encounter for screening for lipoid disorders: Secondary | ICD-10-CM | POA: Diagnosis not present

## 2024-06-18 DIAGNOSIS — Z131 Encounter for screening for diabetes mellitus: Secondary | ICD-10-CM

## 2024-06-18 DIAGNOSIS — G4733 Obstructive sleep apnea (adult) (pediatric): Secondary | ICD-10-CM | POA: Diagnosis not present

## 2024-06-18 DIAGNOSIS — Z136 Encounter for screening for cardiovascular disorders: Secondary | ICD-10-CM | POA: Diagnosis not present

## 2024-06-18 LAB — HEPATIC FUNCTION PANEL
ALT: 16 U/L (ref 0–53)
AST: 20 U/L (ref 0–37)
Albumin: 4.8 g/dL (ref 3.5–5.2)
Alkaline Phosphatase: 65 U/L (ref 39–117)
Bilirubin, Direct: 0.2 mg/dL (ref 0.0–0.3)
Total Bilirubin: 1.3 mg/dL — ABNORMAL HIGH (ref 0.2–1.2)
Total Protein: 7 g/dL (ref 6.0–8.3)

## 2024-06-18 LAB — BASIC METABOLIC PANEL WITH GFR
BUN: 13 mg/dL (ref 6–23)
CO2: 31 meq/L (ref 19–32)
Calcium: 9.6 mg/dL (ref 8.4–10.5)
Chloride: 102 meq/L (ref 96–112)
Creatinine, Ser: 1.47 mg/dL (ref 0.40–1.50)
GFR: 58.62 mL/min — ABNORMAL LOW (ref >60.00)
Glucose, Bld: 93 mg/dL (ref 70–99)
Potassium: 4 meq/L (ref 3.5–5.1)
Sodium: 140 meq/L (ref 135–145)

## 2024-06-18 LAB — LIPID PANEL
Cholesterol: 200 mg/dL (ref 0–200)
HDL: 69.7 mg/dL (ref >39.00)
LDL Cholesterol: 122 mg/dL — ABNORMAL HIGH (ref 0–99)
NonHDL: 129.82
Total CHOL/HDL Ratio: 3
Triglycerides: 40 mg/dL (ref 0.0–149.0)
VLDL: 8 mg/dL (ref 0.0–40.0)

## 2024-06-19 ENCOUNTER — Encounter: Payer: 59 | Admitting: Family Medicine

## 2024-06-21 DIAGNOSIS — G4733 Obstructive sleep apnea (adult) (pediatric): Secondary | ICD-10-CM | POA: Diagnosis not present

## 2024-06-22 ENCOUNTER — Ambulatory Visit: Payer: Self-pay | Admitting: Family Medicine

## 2024-06-23 DIAGNOSIS — M9903 Segmental and somatic dysfunction of lumbar region: Secondary | ICD-10-CM | POA: Diagnosis not present

## 2024-06-23 DIAGNOSIS — M9902 Segmental and somatic dysfunction of thoracic region: Secondary | ICD-10-CM | POA: Diagnosis not present

## 2024-06-23 DIAGNOSIS — M5386 Other specified dorsopathies, lumbar region: Secondary | ICD-10-CM | POA: Diagnosis not present

## 2024-06-23 DIAGNOSIS — M9904 Segmental and somatic dysfunction of sacral region: Secondary | ICD-10-CM | POA: Diagnosis not present

## 2024-06-24 ENCOUNTER — Encounter: Payer: Self-pay | Admitting: Family Medicine

## 2024-06-24 ENCOUNTER — Ambulatory Visit (INDEPENDENT_AMBULATORY_CARE_PROVIDER_SITE_OTHER): Payer: Self-pay | Admitting: Family Medicine

## 2024-06-24 VITALS — BP 114/78 | HR 60 | Temp 98.5°F | Ht 72.0 in | Wt 201.0 lb

## 2024-06-24 DIAGNOSIS — J339 Nasal polyp, unspecified: Secondary | ICD-10-CM

## 2024-06-24 DIAGNOSIS — G4733 Obstructive sleep apnea (adult) (pediatric): Secondary | ICD-10-CM | POA: Diagnosis not present

## 2024-06-24 DIAGNOSIS — Z23 Encounter for immunization: Secondary | ICD-10-CM

## 2024-06-24 DIAGNOSIS — Z0001 Encounter for general adult medical examination with abnormal findings: Secondary | ICD-10-CM | POA: Diagnosis not present

## 2024-06-24 DIAGNOSIS — R001 Bradycardia, unspecified: Secondary | ICD-10-CM | POA: Diagnosis not present

## 2024-06-24 NOTE — Assessment & Plan Note (Signed)
 Preventative protocols reviewed and updated unless pt declined. Discussed healthy diet and lifestyle.

## 2024-06-24 NOTE — Progress Notes (Signed)
 Ph: [phone] Fax: [phone]   Patient ID: Joe Gibson, male    DOB: 03/02/1981, 43 y.o.   MRN: [id number]  This visit was conducted in person.  BP 114/78   Pulse 60   Temp 98.5 F (36.9 C) (Oral)   Ht 6' (1.829 m)   Wt 201 lb (91.2 kg)   SpO2 95%   BMI 27.26 kg/m   BP Readings from Last 3 Encounters:  06/24/24 114/78  06/18/23 124/78  11/29/20 110/78    Pulse Readings from Last 3 Encounters:  06/24/24 60  06/18/23 (!) 58  11/29/20 60   CC: CPE Subjective:   HPI: Joe Gibson is a 43 y.o. 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 Symptoms have fully resolved.  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 Saw neurosurgery Joe Gibson - didn't think this cyst should cause headaches. Headaches have subsequently resolved.    GFR dropped from 90s to 70s to 50s over the past 3 yrs. Fmhx kidney stones. He has never had a kidney stone. No blood in urine.  Started taking creatine powder 5gm/day.  Has upcoming appt with nephrologist tomorrow.   Notes asymptomatic bradycardia for at least 3 yrs (apple watch has recorded as low as 38 during sleep).   OSA - [facility] HST 07/2023 showed mild to moderate sleep apnea with hypoxia <88%, for 7 min or 2% of sleep time, AHI 13.9, RDI (3%) 19.8 - oral appliance through dentist was unaffordable.  CPAP started 12/2023 - autoCPAP 5-20 cmH2O through [facility] fax [phone]. Notes aerophagia since starting CPAP. This is despite nasal pillow, as well as full face mask. Wife notes he doesn't snore when he uses CPAP.   Preventative: Colon cancer screening - not due Prostate cancer screening - not due Lung cancer  screening - not due Flu shot - yearly COVID - 09/2019, 2/201, booster 07/2020 Tetanus shot - unsure  Pneumonia shot - not due  Shingrix - not due Advanced directive discussion - did not discuss Seat belt use discussed Sunscreen use discussed. No changing moles on skin. Sees derm yearly for skin cancer screen.  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 Interim medical history since our last visit reviewed. Allergies and medications reviewed and updated. Outpatient Medications Prior to Visit  Medication Sig Dispense Refill   Misc Natural Products (OSTEO BI-FLEX ADV DOUBLE ST PO) Take by mouth.     Cholecalciferol (D3) 50 MCG (2000 UT) TABS Take by mouth. (Patient not taking: Reported on 06/24/2024)     No facility-administered medications prior to visit.     Per HPI unless specifically indicated in ROS section below Review of Systems  Constitutional:  Negative for activity change, appetite change, chills, fatigue, fever and unexpected weight change.  HENT:  Negative  for hearing loss.   Eyes:  Negative for visual disturbance.  Respiratory:  Negative for cough, chest tightness, shortness of breath and wheezing.   Cardiovascular:  Negative for chest pain, palpitations and leg swelling.  Gastrointestinal:  Negative for abdominal distention, abdominal pain, blood in stool, constipation, diarrhea, nausea and vomiting.  Genitourinary:  Negative for difficulty urinating and hematuria.  Musculoskeletal:  Negative for arthralgias, myalgias and neck  pain.  Skin:  Negative for rash.  Neurological:  Negative for dizziness, seizures, syncope and headaches.  Hematological:  Negative for adenopathy. Does not bruise/bleed easily.  Psychiatric/Behavioral:  Positive for dysphoric mood. The patient is not nervous/anxious.     Objective:  BP 114/78   Pulse 60   Temp 98.5 F (36.9 C) (Oral)   Ht 6' (1.829 m)   Wt 201 lb (91.2 kg)   SpO2 95%   BMI 27.26 kg/m   Wt Readings from Last 3 Encounters:  06/24/24 201 lb (91.2 kg)  06/18/23 192 lb 2 oz (87.1 kg)  11/29/20 202 lb 1.6 oz (91.7 kg)      Physical Exam Vitals and nursing note reviewed.  Constitutional:      General: He is not in acute distress.    Appearance: Normal appearance. He is well-developed. He is not ill-appearing.  HENT:     Head: Normocephalic and atraumatic.     Right Ear: Hearing, tympanic membrane, ear canal and external ear normal.     Left Ear: Hearing, tympanic membrane, ear canal and external ear normal.     Nose:     Right Sinus: No maxillary sinus tenderness or frontal sinus tenderness.     Left Sinus: No maxillary sinus tenderness or frontal sinus tenderness.     Comments: Left nasal polyp     Mouth/Throat:     Mouth: Mucous membranes are moist.     Pharynx: Oropharynx is clear. No oropharyngeal exudate or posterior oropharyngeal erythema.  Eyes:     General: No scleral icterus.    Extraocular Movements: Extraocular movements intact.     Conjunctiva/sclera: Conjunctivae normal.     Pupils: Pupils are equal, round, and reactive to light.  Neck:     Thyroid : No thyroid  mass or thyromegaly.  Cardiovascular:     Rate and Rhythm: Normal rate and regular rhythm.     Pulses: Normal pulses.          Radial pulses are 2+ on the right side and 2+ on the left side.     Heart sounds: Normal heart sounds. No murmur heard. Pulmonary:     Effort: Pulmonary effort is normal. No respiratory distress.     Breath sounds: Normal breath sounds. No wheezing, rhonchi or  rales.  Abdominal:     General: Bowel sounds are normal. There is no distension.     Palpations: Abdomen is soft. There is no mass.     Tenderness: There is no abdominal tenderness. There is no guarding or rebound.     Hernia: No hernia is present.  Musculoskeletal:        General: Normal range of motion.     Cervical back: Normal range of motion and neck supple.     Right lower leg: No edema.     Left lower leg: No edema.  Lymphadenopathy:     Cervical: No cervical adenopathy.  Skin:    General: Skin is warm and dry.     Findings: No rash.  Neurological:     General: No focal  deficit present.     Mental Status: He is alert and oriented to person, place, and time.  Psychiatric:        Mood and Affect: Mood normal.        Behavior: Behavior normal.        Thought Content: Thought content normal.        Judgment: Judgment normal.       Results for orders placed or performed in visit on 06/18/24  Hepatic function panel   Collection Time: 06/18/24  7:36 AM  Result Value Ref Range   Total Bilirubin 1.3 (H) 0.2 - 1.2 mg/dL   Bilirubin, Direct 0.2 0.0 - 0.3 mg/dL   Alkaline Phosphatase 65 39 - 117 U/L   AST 20 0 - 37 U/L   ALT 16 0 - 53 U/L   Total Protein 7.0 6.0 - 8.3 g/dL   Albumin 4.8 3.5 - 5.2 g/dL  Basic metabolic panel with GFR   Collection Time: 06/18/24  7:36 AM  Result Value Ref Range   Sodium 140 135 - 145 mEq/L   Potassium 4.0 3.5 - 5.1 mEq/L   Chloride 102 96 - 112 mEq/L   CO2 31 19 - 32 mEq/L   Glucose, Bld 93 70 - 99 mg/dL   BUN 13 6 - 23 mg/dL   Creatinine, Ser 8.52 0.40 - 1.50 mg/dL   GFR 41.37 (L) >39.99 mL/min   Calcium 9.6 8.4 - 10.5 mg/dL  Lipid panel   Collection Time: 06/18/24  7:36 AM  Result Value Ref Range   Cholesterol 200 0 - 200 mg/dL   Triglycerides 59.9 0.0 - 149.0 mg/dL   HDL 30.29 >60.99 mg/dL   VLDL 8.0 0.0 - 59.9 mg/dL   LDL Cholesterol 877 (H) 0 - 99 mg/dL   Total CHOL/HDL Ratio 3    NonHDL 129.82       06/24/2024    8:35 AM  06/18/2023    2:25 PM  Depression screen PHQ 2/9  Decreased Interest 0 0  Down, Depressed, Hopeless 1 0  PHQ - 2 Score 1 0  Altered sleeping 0   Tired, decreased energy 1   Change in appetite 0   Feeling bad or failure about yourself  1   Trouble concentrating 0   Moving slowly or fidgety/restless 0   Suicidal thoughts 0   PHQ-9 Score 3   Difficult doing work/chores Not difficult at all        06/24/2024    8:35 AM 06/18/2023    2:26 PM  GAD 7 : Generalized Anxiety Score  Nervous, Anxious, on Edge 1 0  Control/stop worrying 0 0  Worry too much - different things 0 0  Trouble relaxing 0 0  Restless 1 0  Easily annoyed or irritable 0 0  Afraid - awful might happen 0 0  Total GAD 7 Score 2 0  Anxiety Difficulty Not difficult at all    EKG - sinus bradycardia rate 50s, normal axis, intervals, no hypertrophy or acute ST/T changes   Assessment & Plan:   Problem List Items Addressed This Visit     Encounter for general adult medical examination with abnormal findings - Primary (Chronic)   Preventative protocols reviewed and updated unless pt declined. Discussed healthy diet and lifestyle.       Nasal polyp   Left sided.  Pt largely asymptomatic.  Will refer to ENT in setting of OSA with CPAP intolerance.       Relevant Orders  Ambulatory referral to ENT   OSA (obstructive sleep apnea)   Mild-mod (AHI 4% - 13.9, RDI 3% - 19.8) Ongoing difficulty tolerating CPAP despite several different masks tried and low pressure settings.  He is having more symptoms from CPAP than from  Will refer to ENT for evaluation for hypoglossal nerve stimulation and nasal polyp, nasal septum.       Relevant Orders   Ambulatory referral to ENT   Bradycardia   Asymptomatic.  Update EKG.  Update if new symptoms develop.       Relevant Orders   EKG 12-Lead   Other Visit Diagnoses       Encounter for immunization       Relevant Orders   Flu vaccine trivalent PF, 6mos and  older(Flulaval,Afluria,Fluarix,Fluzone) (Completed)        No orders of the defined types were placed in this encounter.   Orders Placed This Encounter  Procedures   Flu vaccine trivalent PF, 6mos and older(Flulaval,Afluria,Fluarix,Fluzone)   Ambulatory referral to ENT    Referral Priority:   Routine    Referral Type:   Consultation    Referral Reason:   Specialty Services Required    Requested Specialty:   Otolaryngology    Number of Visits Requested:   1   EKG 12-Lead    Patient Instructions  Flu shot today  EKG today for bradycardia.  I will refer you to ENT to review sleep apnea options.   Follow up plan: Return in about 1 year (around 06/24/2025), or if symptoms worsen or fail to improve, for annual exam, prior fasting for blood work.  Anton Blas, MD

## 2024-06-24 NOTE — Assessment & Plan Note (Signed)
 Asymptomatic.  Update EKG.  Update if new symptoms develop.

## 2024-06-24 NOTE — Patient Instructions (Addendum)
 Flu shot today  EKG today for bradycardia.  I will refer you to ENT to review sleep apnea options.

## 2024-06-24 NOTE — Assessment & Plan Note (Signed)
 Left sided.  Pt largely asymptomatic.  Will refer to ENT in setting of OSA with CPAP intolerance.

## 2024-06-24 NOTE — Assessment & Plan Note (Signed)
 Mild-mod (AHI 4% - 13.9, RDI 3% - 19.8) Ongoing difficulty tolerating CPAP despite several different masks tried and low pressure settings.  He is having more symptoms from CPAP than from  Will refer to ENT for evaluation for hypoglossal nerve stimulation and nasal polyp, nasal septum.

## 2024-06-25 ENCOUNTER — Other Ambulatory Visit: Payer: Self-pay | Admitting: Nephrology

## 2024-06-25 DIAGNOSIS — R944 Abnormal results of kidney function studies: Secondary | ICD-10-CM

## 2024-06-25 DIAGNOSIS — G4733 Obstructive sleep apnea (adult) (pediatric): Secondary | ICD-10-CM | POA: Diagnosis not present

## 2024-06-25 NOTE — Progress Notes (Signed)
 [facility] New Consult Visit  Patient Name: Joe Gibson, male   Patient DOB: [birth year] Date of Service: 06/25/2024  Patient MRN: [id number] Provider Creating Note: Bonnell Sherry, MD  [phone] Primary Care Physician: No primary care provider on file.  [address] [place] [address] Additional Physicians/ Providers:    History of Present Illness Joe Gibson is a 43 y.o. male with past medical history of obstructive sleep apnea who was referred for the evaluation of abnormal renal function testing.  On 11/29/2020 creatinine was 1.01 with an eGFR of 94.  Subsequent to this on 06/11/2023 creatinine was up to 1.1 with eGFR 76.  Most recently on 06/18/2024 creatinine was up to 1.47 with eGFR down to 58.  Patient does not appear to have classic risk factors for underlying chronic kidney disease.  He does have underlying sleep apnea treated with CPAP.  Recently he has been taking creatine.  In addition of note the patient has significant muscle mass compared to the general population.   The following portions of the patient's chart were reviewed in this encounter and updated as appropriate:  Tobacco  Allergies  Meds  Problems  Med Hx  Surg Hx  Fam Hx        Medications   Current Outpatient Medications:  .  Boswellia-Glucosamine-Vit D (OSTEO BI-FLEX ONE PER DAY PO), Take by mouth, Disp: , Rfl:  .  CREATINE PO, Take by mouth, Disp: , Rfl:  .  Glucosamine-Chondroit-Vit C-Mn (GLUCOSAMINE 1500 COMPLEX PO), Take by mouth, Disp: , Rfl:    Allergies Patient has no known allergies.  Problem List There is no problem list on file for this patient.    Review of Systems  Constitutional:  Negative for chills and fever.  HENT:  Negative for congestion, ear pain, hearing loss and sore throat.   Eyes:  Negative for pain and discharge.  Respiratory:  Negative for cough, shortness of breath and wheezing.   Cardiovascular:  Negative for chest pain, palpitations  and leg swelling.  Gastrointestinal:  Negative for abdominal pain, blood in stool, constipation, diarrhea, nausea and vomiting.  Genitourinary:  Negative for dysuria, frequency, hematuria and urgency.  Musculoskeletal:  Negative for back pain, myalgias and neck pain.  Skin:  Negative for rash.  Neurological:  Negative for dizziness, tremors and headaches.  Endo/Heme/Allergies:  Negative for polydipsia. Does not bruise/bleed easily.     History Past Medical History:  Diagnosis Date  . Bradycardia   . Sleep apnea     History reviewed. No pertinent surgical history. Family History  Problem Relation Age of Onset  . Hypertension Mother    Social History   Tobacco Use  . Smoking status: Never  . Smokeless tobacco: Never  Substance Use Topics  . Alcohol use: Yes    Comment: few beers a week        Physical Exam  Vitals BP 102/78 (BP Location: Right upper arm, Patient Position: Sitting)   Pulse 64   Temp 98.2 F   Ht 6' (1.829 m)   Wt 204 lb (92.5 kg)   SpO2 96%   BMI 27.67 kg/m   PHYSICAL EXAM: General appearance: well developed, well nourished, muscular male, NAD Eyes: anicteric sclerae, moist conjunctivae; no lid-lag  HENT: Atraumatic; hearing intact Neck: Trachea midline; supple Lungs: CTAB, with normal respiratory effort  CV: RRR, no MRGs  Abdomen: Soft, non-tender; bowel sounds present Extremities: No peripheral edema, cyanosis, or clubbing. Skin: Warm and dry, normal skin turgor, no  rashes noted. Psych: Appropriate affect, alert and oriented to person, place and time  Neuro: Normal gait  Laboratory Studies   11/29/2020: Creatinine 1.01, eGFR 94 06/11/2023: Creatinine 1.18, eGFR 76 06/18/2024: Creatinine 1.47, eGFR 58  Imaging and Other Studies    Problem List Items Addressed This Visit   None Visit Diagnoses       Abnormal renal function test    -  Primary     Obstructive sleep apnea          Orders Placed This Encounter  . Ultrasound renal  complete  . CYSTATIN C W EGFR  . Renal function panel       Impression/Recommendations   Joe Gibson is a 43 y.o. male with past medical history of obstructive sleep apnea who was referred for the evaluation of abnormal renal function testing.  1.  Abnormal renal function testing likely secondary to increased muscle mass and use of creatine. 2.  Obstructive sleep apnea treated with CPAP.  Plan: Patient presents with elevated creatinine of 1.47 and eGFR of 58.  3 years ago creatinine was 1.01 with an eGFR 94.  Patient does have considerable muscle mass greater than the average male and also has been taking creatine supplements both of which may be accounting for the elevated creatinine now.  We will obtain Cystatin C and compare it to serum creatinine today.  In addition we will obtain renal ultrasound to make sure there is no other underlying abnormalities.  Patient had urinalysis performed today which was negative for hematuria or proteinuria and we also check microalbumin to creatinine ratio which was negative.  We have reassured the patient today.  Further plan based upon testing from above.  I have encouraged him to drink 60 ounces of water per day while taking creatine.  Return in about 4 weeks (around 07/23/2024).   Munsoor Lateef, MD

## 2024-07-02 ENCOUNTER — Ambulatory Visit (HOSPITAL_COMMUNITY)
Admission: RE | Admit: 2024-07-02 | Discharge: 2024-07-02 | Disposition: A | Discharge status: Home / Self Care | Source: Ambulatory Visit | Attending: Nephrology | Admitting: Nephrology

## 2024-07-02 DIAGNOSIS — R944 Abnormal results of kidney function studies: Secondary | ICD-10-CM | POA: Diagnosis not present

## 2024-07-22 ENCOUNTER — Institutional Professional Consult (permissible substitution) (INDEPENDENT_AMBULATORY_CARE_PROVIDER_SITE_OTHER)

## 2024-07-22 DIAGNOSIS — R944 Abnormal results of kidney function studies: Secondary | ICD-10-CM | POA: Diagnosis not present

## 2024-07-22 DIAGNOSIS — G4733 Obstructive sleep apnea (adult) (pediatric): Secondary | ICD-10-CM | POA: Diagnosis not present

## 2024-07-27 ENCOUNTER — Institutional Professional Consult (permissible substitution) (INDEPENDENT_AMBULATORY_CARE_PROVIDER_SITE_OTHER)

## 2024-08-04 DIAGNOSIS — M9903 Segmental and somatic dysfunction of lumbar region: Secondary | ICD-10-CM | POA: Diagnosis not present

## 2024-08-04 DIAGNOSIS — M5386 Other specified dorsopathies, lumbar region: Secondary | ICD-10-CM | POA: Diagnosis not present

## 2024-08-04 DIAGNOSIS — M9904 Segmental and somatic dysfunction of sacral region: Secondary | ICD-10-CM | POA: Diagnosis not present

## 2024-08-04 DIAGNOSIS — M9902 Segmental and somatic dysfunction of thoracic region: Secondary | ICD-10-CM | POA: Diagnosis not present

## 2024-08-13 ENCOUNTER — Institutional Professional Consult (permissible substitution) (INDEPENDENT_AMBULATORY_CARE_PROVIDER_SITE_OTHER)

## 2024-08-18 ENCOUNTER — Encounter (INDEPENDENT_AMBULATORY_CARE_PROVIDER_SITE_OTHER): Payer: Self-pay | Admitting: Otolaryngology

## 2024-08-18 ENCOUNTER — Ambulatory Visit (INDEPENDENT_AMBULATORY_CARE_PROVIDER_SITE_OTHER): Admitting: Otolaryngology

## 2024-08-18 VITALS — BP 118/77 | HR 60 | Ht 72.0 in | Wt 200.0 lb

## 2024-08-18 DIAGNOSIS — J342 Deviated nasal septum: Secondary | ICD-10-CM | POA: Diagnosis not present

## 2024-08-18 DIAGNOSIS — G4733 Obstructive sleep apnea (adult) (pediatric): Secondary | ICD-10-CM | POA: Diagnosis not present

## 2024-08-18 DIAGNOSIS — R0981 Nasal congestion: Secondary | ICD-10-CM | POA: Diagnosis not present

## 2024-08-18 DIAGNOSIS — Z789 Other specified health status: Secondary | ICD-10-CM | POA: Diagnosis not present

## 2024-08-18 DIAGNOSIS — J343 Hypertrophy of nasal turbinates: Secondary | ICD-10-CM | POA: Diagnosis not present

## 2024-08-18 NOTE — Progress Notes (Unsigned)
 Dear Dr. Rilla, Here is my assessment for our mutual patient, Joe Gibson. Thank you for allowing me the opportunity to care for your patient. Please do not hesitate to contact me should you have any other questions. Sincerely, Dr. Eldora Blanch  Otolaryngology Clinic Note  HISTORY: Joe Gibson is a 43 y.o. male kindly referred by Dr. Rilla for evaluation of OSA, possible nasal polyposis.   Initial visit (08/2024):  Discussed the use of AI scribe software for clinical note transcription with the patient, who gave verbal consent to proceed.  History of Present Illness Joe Gibson is a 43 year old male with sleep apnea who presents with difficulty tolerating CPAP therapy. Referred by Dr. Rilla for evaluation of CPAP intolerance.  He was diagnosed with sleep apnea last year by home sleep test and has used CPAP since March. The CPAP rarely stays on through the night due to discomfort and mask issues. He has tried nasal pillows and full-face masks but cannot maintain a good seal, which he attributes to his beard. He has aerophagia with bloating and nausea on waking, and chapping around his nose from a poor seal. He has not noticed improvement in sleep quality.  He has no trouble breathing through his nose unless he has a cold. He denies frequent sinus infections, discolored drainage, facial pain, allergies, prior nasal surgery, or current nasal medications. He previously tried nasal sprays before starting CPAP without benefit.  He does not smoke and travels for work. Allergy testing has not been done. No previous sinonasal surgery. He is currently using no nasal medications.  GLP-1: no AP/AC: no  Tobacco: no  PMHx: possible Juvenile RA (resolved)  RADIOGRAPHIC EVALUATION AND INDEPENDENT REVIEW OF OTHER RECORDS:: Dr. Rilla (06/24/2024): OSA with HST 07/2023 with AHI 13.9, oral appliance considered; CPAP with aerophagia; Noted left nasal polyp; Rx: ref to ENT for OSA and  this BMP (06/18/2024) and CC w/diff (11/29/2020): BUN/Cr 13/1.47; WBC 6.0, Eos 200 MRI Brain 12/15/2020 independently interpreted with respect to nasal cavity: septum deviates left mid-septum, right dorsally; right > left ITH HST (06/24/2023): Noted AHI 13.9, Central index 0.4 (3%); Nadir 83% Past Medical History:  Diagnosis Date   Juvenile rheumatoid arthritis of hand, right (HCC) 1989   followed by [facility], fully resolved    Past Surgical History:  Procedure Laterality Date   ARTHROSCOPIC REPAIR ACL Right 1998   WRIST SURGERY Left 2002   flag football injury - broke scaphoid   Family History  Problem Relation Age of Onset   Glaucoma Maternal Grandmother    Hyperlipidemia Paternal Grandmother    Breast cancer Paternal Grandmother    Hypertension Maternal Grandfather    Kidney disease Paternal Grandfather 90   CAD Neg Hx    Stroke Neg Hx    Diabetes Neg Hx    Social History   Tobacco Use   Smoking status: Never   Smokeless tobacco: Never  Substance Use Topics   Alcohol use: Yes    Comment: occ   No Known Allergies Current Outpatient Medications  Medication Sig Dispense Refill   Misc Natural Products (OSTEO BI-FLEX ADV DOUBLE ST PO) Take by mouth.     Cholecalciferol (D3) 50 MCG (2000 UT) TABS Take by mouth. (Patient not taking: Reported on 08/18/2024)     No current facility-administered medications for this visit.   BP 118/77 (BP Location: Left Arm, Patient Position: Sitting, Cuff Size: Large)   Pulse 60   Ht 6' (1.829 m)   Wt 200 lb (90.7  kg)   SpO2 98%   BMI 27.12 kg/m   PHYSICAL EXAM:  BP 118/77 (BP Location: Left Arm, Patient Position: Sitting, Cuff Size: Large)   Pulse 60   Ht 6' (1.829 m)   Wt 200 lb (90.7 kg)   SpO2 98%   BMI 27.12 kg/m    Salient findings:  CN II-XII intact Bilateral EAC clear and TM intact with well pneumatized middle ear spaces Nose: Anterior rhinoscopy reveals significant left septal deviation, bilateral inferior turbinate hypertrophy  R>L.  Nasal endoscopy was indicated to better evaluate the nose and paranasal sinuses, given the patient's history and exam findings, and is detailed below. No lesions of oral cavity/oropharynx; dentition good; No obviously palpable neck masses/lymphadenopathy/thyromegaly No respiratory distress or stridor; Tongue friedman 3  PROCEDURE:  Prior to initiating any procedures, risks/benefits/alternatives were explained to the patient and verbal consent obtained. Diagnostic Nasal Endoscopy Pre-procedure diagnosis: Concern for nasal septal deviation, nasal obstruction Post-procedure diagnosis: same Indication: See pre-procedure diagnosis and physical exam above Complications: None apparent EBL: 0 mL Anesthesia: Lidocaine 4% and topical decongestant was topically sprayed in each nasal cavity  Description of Procedure:  Patient was identified. A rigid 30 degree endoscope was utilized to evaluate the sinonasal cavities, mucosa, sinus ostia and turbinates and septum.  Overall, signs of mucosal inflammation are not noted.  No mucopurulence, polyps, or masses noted.   Right Middle meatus: clear Right SE Recess: clear Left MM: clear Left SE Recess: clear Photodocumentation was obtained.  CPT CODE -- 31231 - Mod 25   ASSESSMENT:  ***  No diagnosis found.   PLAN: We've discussed issues and options today.  We reviewed the nasal endoscopy images together.  The risks, benefits and alternatives were discussed and questions answered.  He has elected to proceed with:  1) 2) Follow-up in *** -- sooner as necessary.  See below regarding exact medications prescribed this encounter including dosages and route: No orders of the defined types were placed in this encounter.    Thank you for allowing me the opportunity to care for your patient. Please do not hesitate to contact me should you have any other questions.  Sincerely, Eldora Blanch, MD Otolaryngologist (ENT), [facility] Phone: [phone] Fax: [phone]  MDM:  Level ***: 99*** Complexity/Problems addressed: *** Data complexity: *** independent interpretation of *** - Morbidity: ***  - Prescription Drug prescribed or managed: ***  08/18/2024, 2:36 PM

## 2024-08-18 NOTE — Patient Instructions (Signed)
 CPT [id number] - Inspire implant CPT [phone] - DISE

## 2024-10-26 ENCOUNTER — Ambulatory Visit (INDEPENDENT_AMBULATORY_CARE_PROVIDER_SITE_OTHER): Admitting: Otolaryngology

## 2025-06-21 ENCOUNTER — Other Ambulatory Visit

## 2025-06-28 ENCOUNTER — Encounter: Admitting: Family Medicine
